# Patient Record
Sex: Male | Born: 1990 | Race: White | Hispanic: No | Marital: Single | Smoking: Former smoker
Health system: Southern US, Community
[De-identification: ages and names within clinical notes are randomized; demographics above are authoritative.]

---
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
## 2011-04-07 ENCOUNTER — Inpatient Hospital Stay (INDEPENDENT_AMBULATORY_CARE_PROVIDER_SITE_OTHER)
Admission: RE | Admit: 2011-04-07 | Discharge: 2011-04-07 | Disposition: A | Discharge status: Home / Self Care | Payer: Self-pay | Source: Ambulatory Visit | Attending: Emergency Medicine | Admitting: Emergency Medicine

## 2011-04-07 DIAGNOSIS — L600 Ingrowing nail: Secondary | ICD-10-CM

## 2013-11-10 ENCOUNTER — Ambulatory Visit: Payer: Self-pay | Admitting: Physician Assistant

## 2013-11-10 ENCOUNTER — Encounter: Payer: Self-pay | Admitting: Physician Assistant

## 2013-11-10 ENCOUNTER — Ambulatory Visit (INDEPENDENT_AMBULATORY_CARE_PROVIDER_SITE_OTHER): Payer: Managed Care, Other (non HMO) | Admitting: Physician Assistant

## 2013-11-10 VITALS — BP 110/68 | HR 62 | Temp 97.9°F | Resp 16 | Wt 186.0 lb

## 2013-11-10 DIAGNOSIS — L600 Ingrowing nail: Secondary | ICD-10-CM

## 2013-11-10 MED ORDER — SULFAMETHOXAZOLE-TMP DS 800-160 MG PO TABS: 1.0000 | ORAL_TABLET | Freq: Two times a day (BID) | ORAL | Status: DC

## 2013-11-10 NOTE — Progress Notes (Signed)
   Patient ID: Reginald Frye MRN: [id number], DOB: [birth year], 22 y.o. Date of Encounter: 11/10/2013, 2:34 PM    Chief Complaint:  Chief Complaint  Patient presents with  . Ingrown toenails - Bilateral     HPI: 22 y.o. year old white male is here for evaluation of ingrown toenails of both of his first toes.  He says he has had problems with this off and on for 2 years. Says that he has had ingrown toenails cut out at each toe 2-3 times. Says he has had this done by Dr. Modesto Charon here in the past. He has not had this done when he was living in a different area prior to moving here. Also says he thinks has had at least one removed in the emergency room before.  Asked him if he was cutting his toenails straight across her he is cutting them down into the curve. He says that he never cuts them at all. Says that he never really "grew back out" after the prior procedures.     Home Meds: See attached medication section for any medications that were entered at today's visit. The computer does not put those onto this list.The following list is a list of meds entered prior to today's visit.   No current outpatient prescriptions on file prior to visit.   No current facility-administered medications on file prior to visit.    Allergies:  Allergies  Allergen Reactions  . Penicillins Anaphylaxis    Says he had reaction as a baby. Mom told him his throat was swelling.      Review of Systems: See HPI for pertinent ROS. All other ROS negative.    Physical Exam: Blood pressure 110/68, pulse 62, temperature 97.9 F (36.6 C), temperature source Oral, resp. rate 16, weight 186 lb (84.369 kg)., There is no height on file to calculate BMI. General:  WNWD WM.Appears in no acute distress. Lungs: Clear bilaterally to auscultation without wheezes, rales, or rhonchi. Breathing is unlabored. Heart: Regular rhythm. No murmurs, rubs, or gallops. Msk:  Strength and tone normal for  age. Extremities/Skin: Bilateral first toes:  Both sides of the toe nail including the medial and lateral aspects, have swelling and deep erythema. Neuro: Alert and oriented X 3. Moves all extremities spontaneously. Gait is normal. CNII-XII grossly in tact. Psych:  Responds to questions appropriately with a normal affect.     ASSESSMENT AND PLAN:  22 y.o. year old male with  1. Ingrown toenail He has penicillin allergy. Therefore we'll treat with Bactrim. He will schedule followup office visit for resection of ingrown toenails with Dr. Tanya Nones. - sulfamethoxazole-trimethoprim (BACTRIM DS) 800-160 MG per tablet; Take 1 tablet by mouth 2 (two) times daily.  Dispense: 20 tablet; Refill: 0   Signed, [address] [place], [place], [facility] 11/10/2013 2:34 PM

## 2013-11-29 ENCOUNTER — Encounter: Payer: Self-pay | Admitting: Family Medicine

## 2013-11-29 ENCOUNTER — Ambulatory Visit (INDEPENDENT_AMBULATORY_CARE_PROVIDER_SITE_OTHER): Payer: Managed Care, Other (non HMO) | Admitting: Family Medicine

## 2013-11-29 VITALS — BP 126/82 | HR 78 | Temp 98.8°F | Resp 16 | Wt 184.0 lb

## 2013-11-29 DIAGNOSIS — L600 Ingrowing nail: Secondary | ICD-10-CM

## 2013-11-29 NOTE — Progress Notes (Signed)
   Subjective:    Patient ID: Reginald Frye, male    DOB: 1991-04-21, 21 y.o.   MRN: [id number]  HPI Patient presents today with several months of painful ingrown toenails on both the right and left big toes. He tried antibiotics to calm secondary infections but the original ingrown toenail persists bilaterally. Both toenails are ingrown on both the medial and lateral nail margins. He requests removal. No past medical history on file. No current outpatient prescriptions on file prior to visit.   No current facility-administered medications on file prior to visit.   Allergies  Allergen Reactions  . Penicillins Anaphylaxis    Says he had reaction as a baby. Mom told him his throat was swelling.   History   Social History  . Marital Status: Single    Spouse Name: N/A    Number of Children: N/A  . Years of Education: N/A   Occupational History  . Not on file.   Social History Main Topics  . Smoking status: Former Games developer  . Smokeless tobacco: Not on file  . Alcohol Use: No  . Drug Use: No  . Sexual Activity: Not on file   Other Topics Concern  . Not on file   Social History Narrative  . No narrative on file      Review of Systems  All other systems reviewed and are negative.       Objective:   Physical Exam  Vitals reviewed. Cardiovascular: Normal rate and regular rhythm.   Pulmonary/Chest: Effort normal and breath sounds normal.  Right great toenail is ingrown on both the medial and lateral nail margins. Left great toenail is ingrown on both the medial and lateral nail margins        Assessment & Plan:  1. Ingrown left big toenail This was anesthetized with a digital block using 0.1% lidocaine without epinephrine. A tourniquet was then applied. The toenail was then separated using an elevator. The toenail was then removed with gentle traction using a pair of hemostats. The wound bed was then covered in Polysporin, petroleum gauze, and the wrapped in coban.   Wound care was discussed. Follow up in one week if no better sooner if worse.  The tourniquet was removed. Total tourniquet time was less than 2 minutes.  2. Ingrown right big toenail I then turned my attention to the right great toenail.This was anesthetized with a digital block using 0.1% lidocaine without epinephrine. A tourniquet was then applied. The toenail was then separated using an elevator. The toenail was then removed with gentle traction using a pair of hemostats. The wound bed was then covered in Polysporin, petroleum gauze, and the wrapped in coban.  Wound care was discussed. Follow up in one week if no better sooner if worse.  Tourniquet was then removed. Total tourniquet time was less than 2 minutes.

## 2014-07-27 ENCOUNTER — Telehealth: Payer: Self-pay | Admitting: Physician Assistant

## 2014-07-27 DIAGNOSIS — L600 Ingrowing nail: Secondary | ICD-10-CM

## 2014-07-27 NOTE — Telephone Encounter (Signed)
PATIENT IS CALLING TO ASK IF HE CAN HAVE REFERRAL TO FOOT DOCTOR HIS INGROWN TOENAILS HAVE COME BACK

## 2014-07-28 NOTE — Telephone Encounter (Signed)
ok to do referral

## 2014-07-28 NOTE — Telephone Encounter (Signed)
Yes. Approved. 

## 2014-07-29 NOTE — Telephone Encounter (Signed)
Referral initiated

## 2014-08-05 ENCOUNTER — Encounter: Payer: Self-pay | Admitting: Podiatry

## 2014-08-05 ENCOUNTER — Ambulatory Visit (INDEPENDENT_AMBULATORY_CARE_PROVIDER_SITE_OTHER): Payer: Managed Care, Other (non HMO) | Admitting: Podiatry

## 2014-08-05 VITALS — BP 127/67 | HR 71 | Ht 68.0 in | Wt 177.0 lb

## 2014-08-05 DIAGNOSIS — M79609 Pain in unspecified limb: Secondary | ICD-10-CM

## 2014-08-05 DIAGNOSIS — L03039 Cellulitis of unspecified toe: Secondary | ICD-10-CM

## 2014-08-05 DIAGNOSIS — L600 Ingrowing nail: Secondary | ICD-10-CM | POA: Insufficient documentation

## 2014-08-05 MED ORDER — SULFAMETHOXAZOLE-TMP DS 800-160 MG PO TABS: 1.0000 | ORAL_TABLET | Freq: Two times a day (BID) | ORAL | Status: AC

## 2014-08-05 NOTE — Progress Notes (Signed)
Has had ingrown nail taken out 4-5 times and still getting ingrown nails on both great toes.  Patient is requesting permanent solution to his on going ingrown nail condition.  Objective: Inflamed ungual labia with drainage and dry old blood. Positive of proud flash on both borders of both great toes.   Neurovascular status are within normal.  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  Plan: Reviewed findings and available options. Left great toe nail surgery done: Procedure: Phenol and Alcohol matrixectomy left hallux both borders. Local used: 50/50 mixture 0.5% Marcaine plain and 1% Xylocaine plain total 5ml. Patient tolerated well. Soaking home care instruction with supply dispensed. Take antibiotics. Return in one week. May do the other side ingrown nail surgery if wound healing is within normal.

## 2014-08-05 NOTE — Patient Instructions (Signed)
Left great toe nail surgery done. Follow soaking instruction. Return in one week.

## 2014-08-12 ENCOUNTER — Ambulatory Visit (INDEPENDENT_AMBULATORY_CARE_PROVIDER_SITE_OTHER): Payer: Managed Care, Other (non HMO) | Admitting: Podiatry

## 2014-08-12 ENCOUNTER — Encounter: Payer: Self-pay | Admitting: Podiatry

## 2014-08-12 VITALS — BP 124/71 | HR 73 | Ht 68.0 in | Wt 177.0 lb

## 2014-08-12 DIAGNOSIS — L600 Ingrowing nail: Secondary | ICD-10-CM

## 2014-08-12 DIAGNOSIS — L03039 Cellulitis of unspecified toe: Secondary | ICD-10-CM

## 2014-08-12 NOTE — Patient Instructions (Signed)
Right great toe nail surgery done. Follow the same instruction as before. Finish antibiotics. Return as needed.

## 2014-08-12 NOTE — Progress Notes (Signed)
Subjective: One week follow up on left toe nails surgery. Scheduled to have right big toe nail surgery.  Stated that he soaked and taken antibiotics as instructed.   Objective: Inflamed ungual labia with drainage and dry old blood on right great toe.  Cleans post surgical nail borders left great toe with minimum drainage.  Neurovascular status are within normal.   Assessment: Good wound healing left great toe follow P&A matrixectomy both borders.  Chronic ingrown nail right great toes with active drainage.   Plan:  eviewed findings. Right great toe nail surgery done:  Procedure: Phenol and Alcohol matrixectomy right hallux both borders.  Local used: 50/50 mixture 0.5% Marcaine plain and 1% Xylocaine plain total 5ml.  Patient tolerated well. Soaking home care instruction with supply dispensed.  Take antibiotics.  Return in one week. May do the other side ingrown nail surgery if wound healing is within normal.

## 2019-01-26 ENCOUNTER — Ambulatory Visit: Payer: Self-pay | Admitting: Primary Care

## 2019-01-28 ENCOUNTER — Ambulatory Visit: Payer: Self-pay | Admitting: Primary Care

## 2024-02-16 ENCOUNTER — Telehealth: Payer: BC Managed Care – PPO | Admitting: Physician Assistant

## 2024-02-16 DIAGNOSIS — R6889 Other general symptoms and signs: Secondary | ICD-10-CM | POA: Diagnosis not present

## 2024-02-16 MED ORDER — OSELTAMIVIR PHOSPHATE 75 MG PO CAPS: 75.0000 mg | ORAL_CAPSULE | Freq: Two times a day (BID) | ORAL | 0 refills | Status: AC

## 2024-02-16 NOTE — Progress Notes (Signed)
E visit for Flu like symptoms   We are sorry that you are not feeling well.  Here is how we plan to help! Based on what you have shared with me it looks like you may have a respiratory virus that may be influenza.  Influenza or "the flu" is   an infection caused by a respiratory virus. The flu virus is highly contagious and persons who did not receive their yearly flu vaccination may "catch" the flu from close contact.  We have anti-viral medications to treat the viruses that cause this infection. They are not a "cure" and only shorten the course of the infection. These prescriptions are most effective when they are given within the first 2 days of "flu" symptoms. Antiviral medication are indicated if you have a high risk of complications from the flu. You should  also consider an antiviral medication if you are in close contact with someone who is at risk. These medications can help patients avoid complications from the flu  but have side effects that you should know. Possible side effects from Tamiflu or oseltamivir include nausea, vomiting, diarrhea, dizziness, headaches, eye redness, sleep problems or other respiratory symptoms. You should not take Tamiflu if you have an allergy to oseltamivir or any to the ingredients in Tamiflu.  Based upon your symptoms and potential risk factors I have prescribed Oseltamivir (Tamiflu).  It has been sent to your designated pharmacy.  You will take one 75 mg capsule orally twice a day for the next 5 days.  You can continue OTC symptomatic medication of choice as needed with the Tamiflu.  ANYONE WHO HAS FLU SYMPTOMS SHOULD: Stay home. The flu is highly contagious and going out or to work exposes others! Be sure to drink plenty of fluids. Water is fine as well as fruit juices, sodas and electrolyte beverages. You may want to stay away from caffeine or alcohol. If you are nauseated, try taking small sips of liquids. How do you know if you are getting enough fluid?  Your urine should be a pale yellow or almost colorless. Get rest. Taking a steamy shower or using a humidifier may help nasal congestion and ease sore throat pain. Using a saline nasal spray works much the same way. Cough drops, hard candies and sore throat lozenges may ease your cough. Line up a caregiver. Have someone check on you regularly.   GET HELP RIGHT AWAY IF: You cannot keep down liquids or your medications. You become short of breath Your fell like you are going to pass out or loose consciousness. Your symptoms persist after you have completed your treatment plan MAKE SURE YOU  Understand these instructions. Will watch your condition. Will get help right away if you are not doing well or get worse.  Your e-visit answers were reviewed by a board certified advanced clinical practitioner to complete your personal care plan.  Depending on the condition, your plan could have included both over the counter or prescription medications.  If there is a problem please reply  once you have received a response from your provider.  Your safety is important to [place].  If you have drug allergies check your prescription carefully.    You can use MyChart to ask questions about today's visit, request a non-urgent call back, or ask for a work or school excuse for 24 hours related to this e-Visit. If it has been greater than 24 hours you will need to follow up with your provider, or enter a new  e-Visit to address those concerns.  You will get an e-mail in the next two days asking about your experience.  I hope that your e-visit has been valuable and will speed your recovery. Thank you for using e-visits.   I have spent 5 minutes in review of e-visit questionnaire, review and updating patient chart, medical decision making and response to patient.   Margaretann Loveless, PA-C
